# Patient Record
Sex: Male | Born: 1990 | Race: White | Hispanic: No | Marital: Married | Smoking: Never smoker
Health system: Southern US, Community
[De-identification: ages and names within clinical notes are randomized; demographics above are authoritative.]

## PROBLEM LIST (undated history)

## (undated) DIAGNOSIS — I10 Essential (primary) hypertension: Secondary | ICD-10-CM

---

## 2016-06-25 ENCOUNTER — Encounter (HOSPITAL_BASED_OUTPATIENT_CLINIC_OR_DEPARTMENT_OTHER): Payer: Self-pay | Admitting: *Deleted

## 2016-06-25 ENCOUNTER — Emergency Department (HOSPITAL_BASED_OUTPATIENT_CLINIC_OR_DEPARTMENT_OTHER): Payer: BLUE CROSS/BLUE SHIELD

## 2016-06-25 ENCOUNTER — Emergency Department (HOSPITAL_BASED_OUTPATIENT_CLINIC_OR_DEPARTMENT_OTHER)
Admission: EM | Admit: 2016-06-25 | Discharge: 2016-06-25 | Disposition: A | Discharge status: Home / Self Care | Payer: BLUE CROSS/BLUE SHIELD | Attending: Emergency Medicine | Admitting: Emergency Medicine

## 2016-06-25 DIAGNOSIS — R1032 Left lower quadrant pain: Secondary | ICD-10-CM | POA: Insufficient documentation

## 2016-06-25 DIAGNOSIS — R197 Diarrhea, unspecified: Secondary | ICD-10-CM | POA: Diagnosis not present

## 2016-06-25 LAB — COMPREHENSIVE METABOLIC PANEL
ALT: 49 U/L (ref 17–63)
AST: 72 U/L — ABNORMAL HIGH (ref 15–41)
Albumin: 4.3 g/dL (ref 3.5–5.0)
Alkaline Phosphatase: 52 U/L (ref 38–126)
Anion gap: 9 (ref 5–15)
BUN: 16 mg/dL (ref 6–20)
CHLORIDE: 103 mmol/L (ref 101–111)
CO2: 26 mmol/L (ref 22–32)
Calcium: 9.2 mg/dL (ref 8.9–10.3)
Creatinine, Ser: 1.04 mg/dL (ref 0.61–1.24)
Glucose, Bld: 100 mg/dL — ABNORMAL HIGH (ref 65–99)
POTASSIUM: 3.5 mmol/L (ref 3.5–5.1)
SODIUM: 138 mmol/L (ref 135–145)
Total Bilirubin: 0.4 mg/dL (ref 0.3–1.2)
Total Protein: 7.7 g/dL (ref 6.5–8.1)

## 2016-06-25 LAB — URINALYSIS, ROUTINE W REFLEX MICROSCOPIC
BILIRUBIN URINE: NEGATIVE
Glucose, UA: NEGATIVE mg/dL
Hgb urine dipstick: NEGATIVE
Ketones, ur: NEGATIVE mg/dL
Leukocytes, UA: NEGATIVE
NITRITE: NEGATIVE
PROTEIN: NEGATIVE mg/dL
SPECIFIC GRAVITY, URINE: 1.027 (ref 1.005–1.030)
pH: 6.5 (ref 5.0–8.0)

## 2016-06-25 LAB — CBC
HEMATOCRIT: 45.5 % (ref 39.0–52.0)
HEMOGLOBIN: 16 g/dL (ref 13.0–17.0)
MCH: 30.5 pg (ref 26.0–34.0)
MCHC: 35.2 g/dL (ref 30.0–36.0)
MCV: 86.7 fL (ref 78.0–100.0)
Platelets: 240 10*3/uL (ref 150–400)
RBC: 5.25 MIL/uL (ref 4.22–5.81)
RDW: 12.9 % (ref 11.5–15.5)
WBC: 6.9 10*3/uL (ref 4.0–10.5)

## 2016-06-25 LAB — LIPASE, BLOOD: LIPASE: 33 U/L (ref 11–51)

## 2016-06-25 MED ORDER — HYDROCODONE-ACETAMINOPHEN 5-325 MG PO TABS: 2.0000 | ORAL_TABLET | ORAL | 0 refills | Status: DC | PRN

## 2016-06-25 MED ORDER — MORPHINE SULFATE (PF) 4 MG/ML IV SOLN
4.0000 mg | Freq: Once | INTRAVENOUS | Status: AC
  Administered 2016-06-25: 4 mg via INTRAVENOUS
  Filled 2016-06-25: qty 1

## 2016-06-25 MED ORDER — IOPAMIDOL (ISOVUE-300) INJECTION 61%
100.0000 mL | Freq: Once | INTRAVENOUS | Status: AC | PRN
Start: 2016-06-25 — End: 2016-06-25
  Administered 2016-06-25: 100 mL via INTRAVENOUS

## 2016-06-25 NOTE — ED Notes (Signed)
Patient transported to CT 

## 2016-06-25 NOTE — ED Notes (Signed)
Pt just used the restroom before triage.

## 2016-06-25 NOTE — ED Notes (Signed)
ED Provider at bedside. 

## 2016-06-25 NOTE — ED Triage Notes (Signed)
Abdominal pain since yesterday. [facility] here to r/o diverticulitis.

## 2016-06-25 NOTE — Discharge Instructions (Signed)
All other labs have been normal today. Your urine is normal. Your CAT scan shows no signs of diverticulitis. He did have a slightly enlarged spleen. I would avoid any physical contact sports. You need to follow up with your primary care doctor for possible repeat imaging. If he developed any pain in your left upper quadrant, fevers, or any other infectious symptoms please return to the ED.

## 2016-06-25 NOTE — ED Provider Notes (Signed)
[facility]   CSN: [id number] Arrival date & time: 06/25/16  1823  By signing my name below, I, Ryan Long, attest that this documentation has been prepared under the direction and in the presence of [employer], PA-C. Electronically Signed: Garen Lah, Scribe. 06/25/2016. 7:44 PM.  History   Chief Complaint Chief Complaint  Patient presents with  . Abdominal Pain    The history is provided by the patient. No language interpreter was used.    HPI Comments:  Tommy Murphy is an otherwise healthy 26 y.o. male who presents to the Emergency Department complaining of intermittent, moderate LLQ abdominal pain onset yesterday, worsening and constant since this morning. Pt describes his pain as sharp. He notes [facility] and was [facility] to [facility] to r/o diverticulitis. He notes the pain exacerbates with ambulation and position changes, and his pain is alleviated mildly with sitting still. Pt has associated symptoms of loose, darker diarrhea and states his last BM was earlier this afternoon. Denies any melena or hematochezia. No treatments for his pain were tried prior to coming into the ED. Per pt, he has no h/o diverticulitis, but a maternal Fhx of diverticulitis. He denies nausea, vomiting, fever, hematochezia, urgency, frequency, hematuria, dysuria, difficulty urinating, and any other associated symptoms at this time.  History reviewed. No pertinent past medical history.  There are no active problems to display for this patient.  History reviewed. No pertinent surgical history.  Home Medications    Prior to Admission medications   Not on File   Family History No family history on file.  Social History Social History  Substance Use Topics  . Smoking status: Never Smoker  . Smokeless tobacco: Never Used  . Alcohol use Yes   Allergies   Patient has no known allergies.  Review of Systems Review of Systems  Constitutional: Negative  for fever.  Gastrointestinal: Positive for abdominal pain and diarrhea. Negative for blood in stool, nausea and vomiting.  Genitourinary: Negative for difficulty urinating, dysuria, frequency, hematuria and urgency.  All other systems reviewed and are negative.  Physical Exam Updated Vital Signs BP 134/96 (BP Location: Left Arm)   Pulse 80   Temp 99.7 F (37.6 C) (Oral)   Resp 20   Ht 6\' 1"  (1.854 m)   Wt 117.9 kg   SpO2 95%   BMI 34.30 kg/m   Physical Exam  Constitutional: He is oriented to person, place, and time. He appears well-developed and well-nourished. No distress.  Patient is nontoxic appearing and in no acute distress.  HENT:  Head: Normocephalic and atraumatic.  Eyes: Conjunctivae are normal. Pupils are equal, round, and reactive to light.  Neck: Normal range of motion. Neck supple.  Cardiovascular: Normal rate, regular rhythm, normal heart sounds and intact distal pulses.   Pulmonary/Chest: Effort normal and breath sounds normal.  Abdominal: Soft. Bowel sounds are normal. He exhibits no distension. There is tenderness in the left lower quadrant. There is no rigidity, no rebound, no guarding and no CVA tenderness.  Musculoskeletal: Normal range of motion.  Lymphadenopathy:    He has no cervical adenopathy.  Neurological: He is alert and oriented to person, place, and time.  Skin: Skin is warm and dry. Capillary refill takes less than 2 seconds.  Psychiatric: He has a normal mood and affect.  Nursing note and vitals reviewed.  ED Treatments / Results  DIAGNOSTIC STUDIES:  Oxygen Saturation is 99% on RA, normal by my interpretation.  COORDINATION OF CARE:  7:42 PM Discussed treatment plan with pt at bedside including CAT scan A/P and pt agreed to plan.  Labs (all labs ordered are listed, but only abnormal results are displayed) Labs Reviewed  COMPREHENSIVE METABOLIC PANEL - Abnormal; Notable for the following:       Result Value   Glucose, Bld 100 (*)     AST 72 (*)    All other components within normal limits  URINALYSIS, ROUTINE W REFLEX MICROSCOPIC  LIPASE, BLOOD  CBC   EKG  EKG Interpretation None      Radiology Ct Abdomen Pelvis W Contrast  Result Date: 06/25/2016 CLINICAL DATA:  Acute onset of left lower quadrant abdominal pain and diarrhea. Initial encounter. EXAM: CT ABDOMEN AND PELVIS WITH CONTRAST TECHNIQUE: Multidetector CT imaging of the abdomen and pelvis was performed using the standard protocol following bolus administration of intravenous contrast. CONTRAST:  100mL ISOVUE-300 IOPAMIDOL (ISOVUE-300) INJECTION 61% COMPARISON:  None. FINDINGS: Lower chest: The visualized lung bases are grossly clear. The visualized portions of the mediastinum are unremarkable. Hepatobiliary: The liver is unremarkable in appearance. The gallbladder is unremarkable in appearance. The common bile duct remains normal in caliber. Pancreas: The pancreas is within normal limits. Spleen:  The spleen appears enlarged, measuring 14.8 cm in length. Adrenals/Urinary Tract: The adrenal glands are unremarkable in appearance. The kidneys are within normal limits. There is no evidence of hydronephrosis. No renal or ureteral stones are identified. No perinephric stranding is seen. Stomach/Bowel: The stomach is unremarkable in appearance. The small bowel is within normal limits. The appendix is normal in caliber, without evidence of appendicitis. The colon is unremarkable in appearance. Vascular/Lymphatic: The abdominal aorta is unremarkable in appearance. The inferior vena cava is grossly unremarkable. No retroperitoneal lymphadenopathy is seen. No pelvic sidewall lymphadenopathy is identified. Reproductive: The bladder is mildly distended and grossly unremarkable. The prostate remains normal in size. Other: No additional soft tissue abnormalities are seen. Musculoskeletal: No acute osseous abnormalities are identified. The visualized musculature is unremarkable in  appearance. IMPRESSION: 1. No acute abnormality seen within the abdomen or pelvis. 2. Splenomegaly noted.  This may reflect the patient's baseline. Electronically Signed   By: Roanna RaiderJeffery  Chang M.D.   On: 06/25/2016 21:13    Procedures Procedures   Medications Ordered in ED Medications  morphine 4 MG/ML injection 4 mg (4 mg Intravenous Given 06/25/16 1954)  iopamidol (ISOVUE-300) 61 % injection 100 mL (100 mLs Intravenous Contrast Given 06/25/16 2046)    Initial Impression / Assessment and Plan / ED Course  I have reviewed the triage vital signs and the nursing notes.  Pertinent labs & imaging results that were available during my care of the patient were reviewed by me and considered in my medical decision making (see chart for details).     The patient presents to the ED with complaint of left lower quadrant abdominal pain onset 2 days ago. Patient was seen at urgent care today and sent to the ED for evaluation for diverticulitis. Patient does have tenderness in his left lower quadrant. No rebound or signs of peritonitis. Labs and reassuring. Patient without a leukocytosis. Lipase is normal. Given patient's tenderness we'll obtain CT of abdomen. CT of abdomen showed no acute findings for diverticulitis. Does note splenomegaly. Patient has no history. Patient denies any mono-like symptoms or any other infectious symptoms. He has no pain in his left upper quadrant. Did not feel this is associated patient's current pain. I have encouraged to follow up with his  primary care doctor for further workup as splenomegaly. Patient's pain was controlled with morphine. He remains afebrile, not tachycardic, not hypoxic in the ED. Urine without any signs of infection. Repeat abdominal exam is benign. Patient discharged home with symptomatic treatment and given strict instructions for follow-up with their primary care physician.  I have also discussed reasons to return immediately to the ER.  Patient expresses  understanding and agrees with plan. Patient was discussed with Dr. Karma Ganja who agrees with the above plan. Pt is hemodynamically stable, in NAD, & able to ambulate in the ED. Pain has been managed & has no complaints prior to dc. Pt is comfortable with above plan and is stable for discharge at this time. All questions were answered prior to disposition. Strict return precautions for f/u to the ED were discussed.       Final Clinical Impressions(s) / ED Diagnoses   Final diagnoses:  LLQ abdominal pain   New Prescriptions Discharge Medication List as of 06/25/2016  9:48 PM    START taking these medications   Details  HYDROcodone-acetaminophen (NORCO/VICODIN) 5-325 MG tablet Take 2 tablets by mouth every 4 (four) hours as needed., Starting Tue 06/25/2016, Print       I personally performed the services described in this documentation, which was scribed in my presence. The recorded information has been reviewed and is accurate.     Rise Mu, PA-C 06/26/16 2841    Jerelyn Scott, MD 06/26/16 [phone]

## 2017-06-08 ENCOUNTER — Ambulatory Visit (HOSPITAL_BASED_OUTPATIENT_CLINIC_OR_DEPARTMENT_OTHER)
Admission: RE | Admit: 2017-06-08 | Discharge: 2017-06-08 | Disposition: A | Discharge status: Home / Self Care | Payer: BLUE CROSS/BLUE SHIELD | Source: Ambulatory Visit | Attending: Family Medicine | Admitting: Family Medicine

## 2017-06-08 ENCOUNTER — Other Ambulatory Visit (HOSPITAL_BASED_OUTPATIENT_CLINIC_OR_DEPARTMENT_OTHER): Payer: Self-pay | Admitting: Family Medicine

## 2017-06-08 DIAGNOSIS — S93409A Sprain of unspecified ligament of unspecified ankle, initial encounter: Secondary | ICD-10-CM

## 2017-06-08 DIAGNOSIS — X58XXXA Exposure to other specified factors, initial encounter: Secondary | ICD-10-CM | POA: Diagnosis not present

## 2017-06-08 DIAGNOSIS — S8252XA Displaced fracture of medial malleolus of left tibia, initial encounter for closed fracture: Secondary | ICD-10-CM | POA: Insufficient documentation

## 2017-06-08 DIAGNOSIS — S99912A Unspecified injury of left ankle, initial encounter: Secondary | ICD-10-CM | POA: Diagnosis present

## 2018-02-16 IMAGING — DX DG ANKLE COMPLETE 3+V*L*
3 series · 3 of 3 positions shown · non-contrast
Comparison: None

CLINICAL DATA: Twisted ankle.  Medial pain.

EXAM:
LEFT ANKLE COMPLETE - 3+ VIEW

[ankle ap]
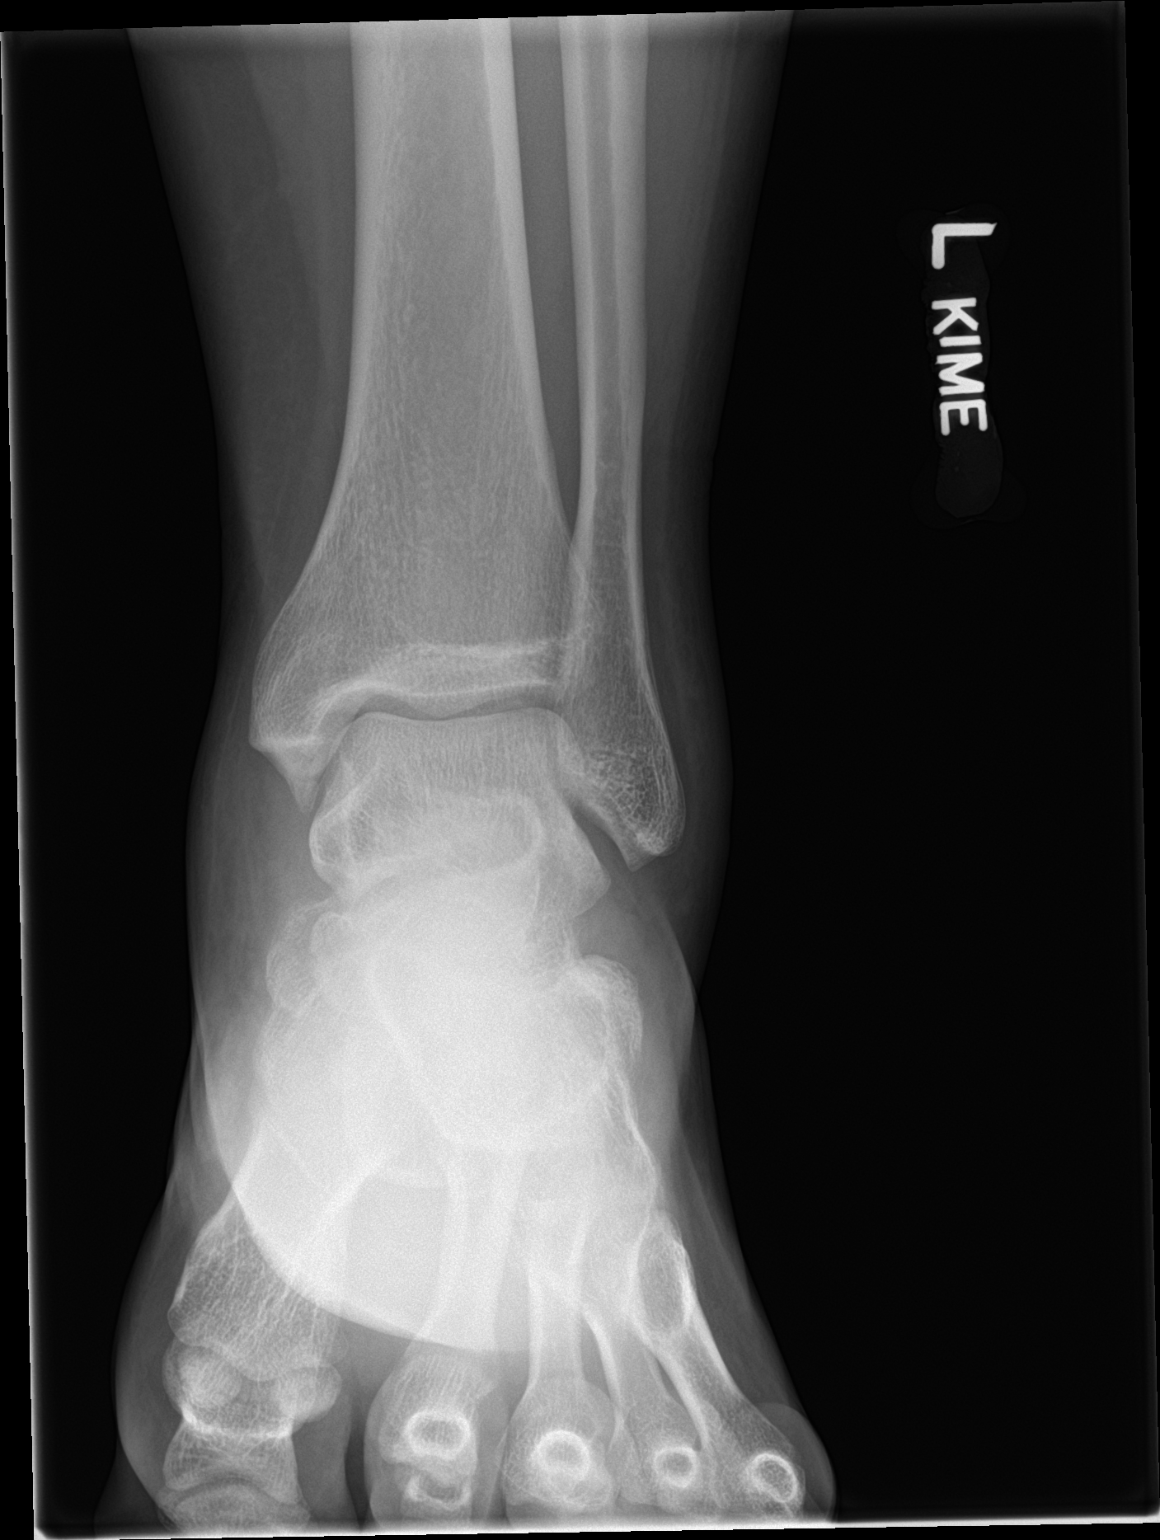

[ankle obl]
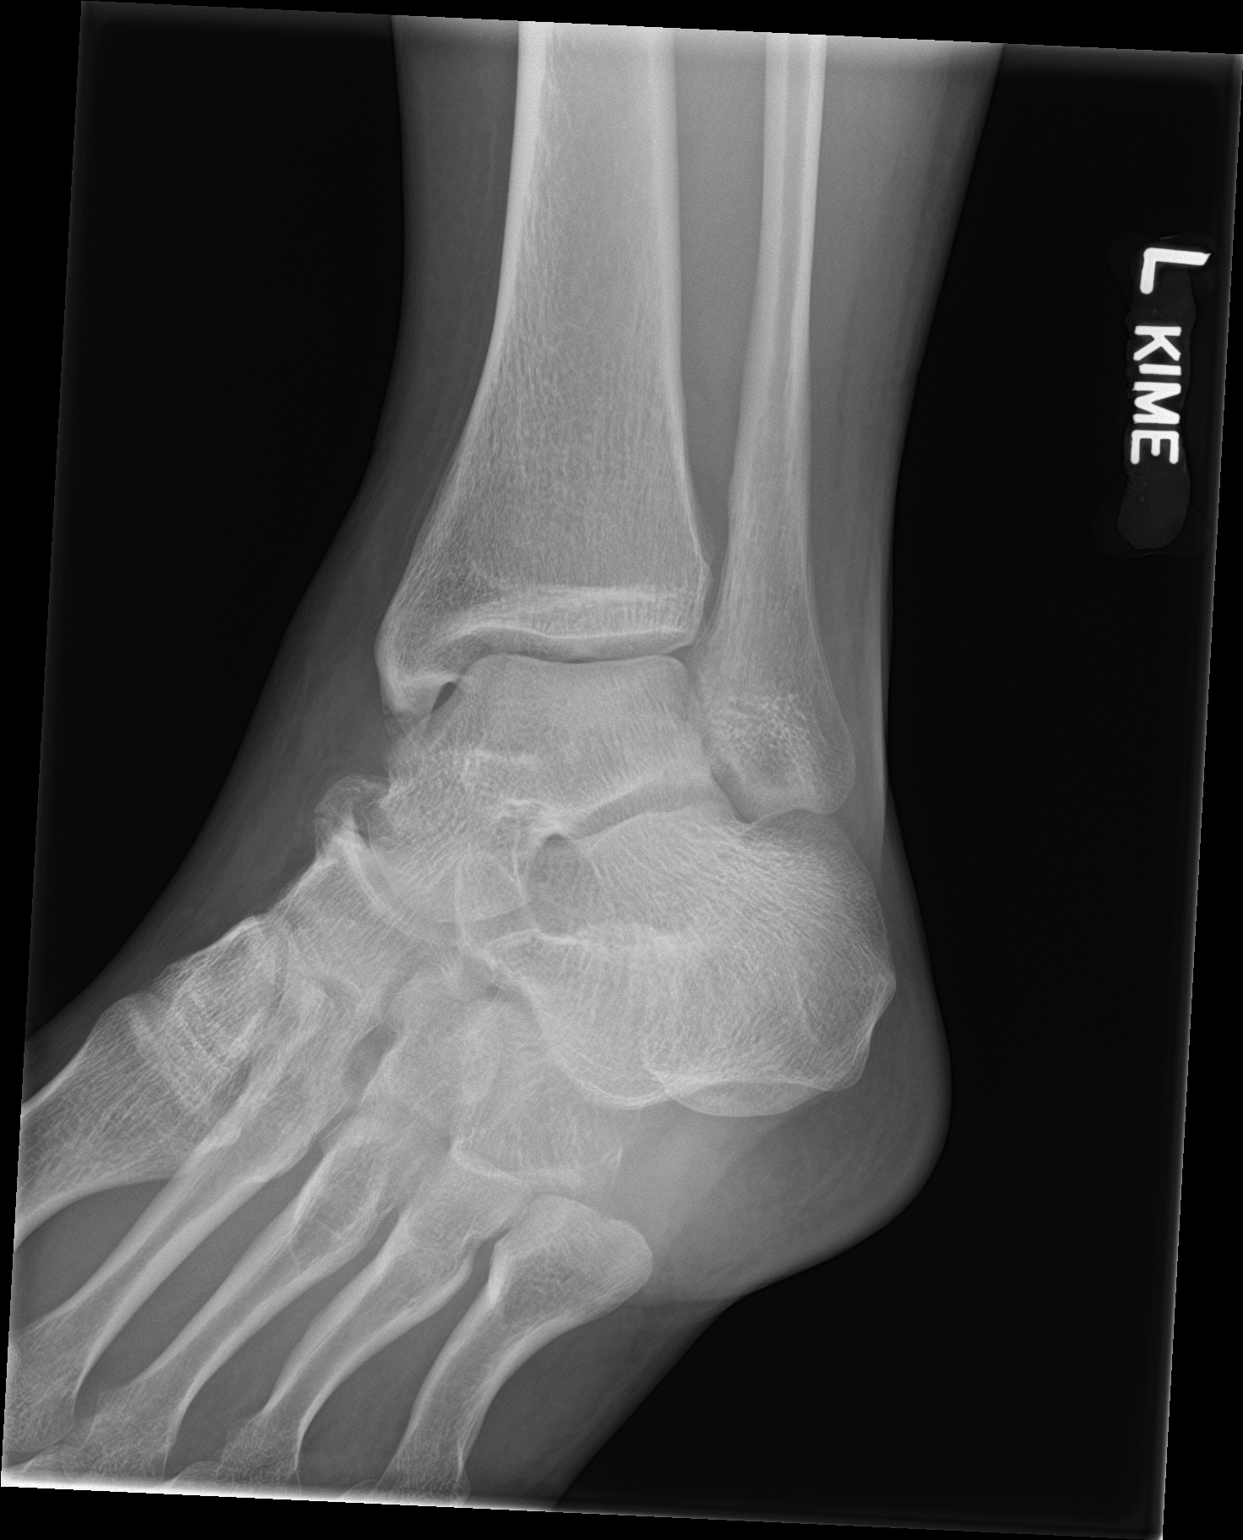

[ankle lat]
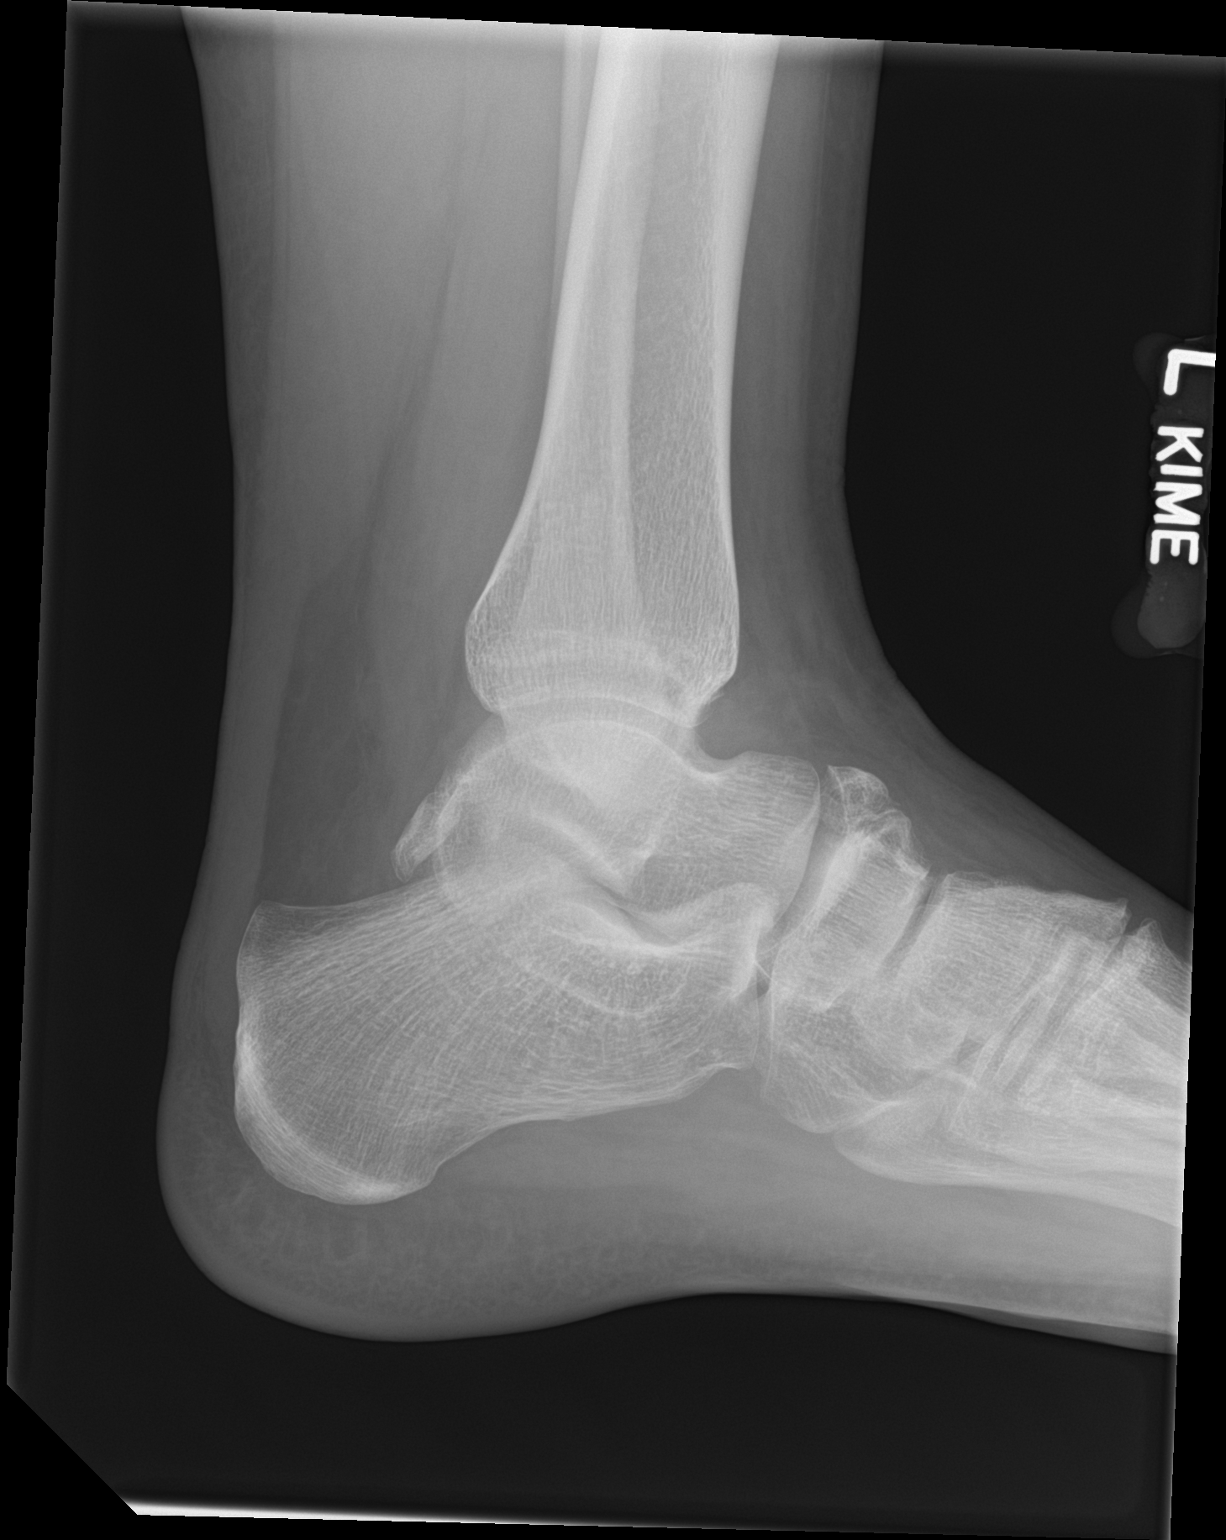

[3 of 3 positions shown; findings below may reference images not displayed]

FINDINGS: Small nondisplaced avulsion type fracture involving the distal tip
of the medial malleolus. Associated moderate medial soft tissue
swelling. The ankle mortise is maintained. No fracture of the
fibula. Large os trigonum is noted along with probable unfused
secondary ossification center or old avulsion fracture involving the
dorsum of the navicular bone.
IMPRESSION: Small avulsion fracture involving the distal tip of the medial
malleolus.

## 2021-11-24 ENCOUNTER — Emergency Department (HOSPITAL_BASED_OUTPATIENT_CLINIC_OR_DEPARTMENT_OTHER): Payer: 59

## 2021-11-24 ENCOUNTER — Encounter (HOSPITAL_BASED_OUTPATIENT_CLINIC_OR_DEPARTMENT_OTHER): Payer: Self-pay | Admitting: Urology

## 2021-11-24 ENCOUNTER — Other Ambulatory Visit: Payer: Self-pay

## 2021-11-24 DIAGNOSIS — W108XXA Fall (on) (from) other stairs and steps, initial encounter: Secondary | ICD-10-CM | POA: Diagnosis not present

## 2021-11-24 DIAGNOSIS — S65302A Unspecified injury of deep palmar arch of left hand, initial encounter: Secondary | ICD-10-CM | POA: Diagnosis present

## 2021-11-24 DIAGNOSIS — I10 Essential (primary) hypertension: Secondary | ICD-10-CM | POA: Diagnosis not present

## 2021-11-24 DIAGNOSIS — S62339A Displaced fracture of neck of unspecified metacarpal bone, initial encounter for closed fracture: Secondary | ICD-10-CM | POA: Insufficient documentation

## 2021-11-24 NOTE — ED Triage Notes (Signed)
Tripped going down stairs and hit left hand approx 1 hr PTA  Denies hitting head, denies LOC  Left hand pain and swelling

## 2021-11-25 ENCOUNTER — Emergency Department (HOSPITAL_BASED_OUTPATIENT_CLINIC_OR_DEPARTMENT_OTHER)
Admission: EM | Admit: 2021-11-25 | Discharge: 2021-11-25 | Disposition: A | Discharge status: Home / Self Care | Payer: 59 | Attending: Emergency Medicine | Admitting: Emergency Medicine

## 2021-11-25 DIAGNOSIS — S62339A Displaced fracture of neck of unspecified metacarpal bone, initial encounter for closed fracture: Secondary | ICD-10-CM

## 2021-11-25 HISTORY — DX: Essential (primary) hypertension: I10

## 2021-11-25 NOTE — ED Provider Notes (Signed)
[facility] Provider Note: Lowella Dell, MD, FACEP  CSN: [id number] MRN: [id number] ARRIVAL: 11/24/21 at 2201 ROOM: [facility]08/[facility]08   CHIEF COMPLAINT  Hand Injury   HISTORY OF PRESENT ILLNESS  11/25/21 12:35 AM Tommy Murphy is a 31 y.o. male who tripped going down the stairs about 1 hour prior to arrival.  He hit his left hand and is having pain overlying the left fifth metacarpal with associated swelling.  He rates his pain as a 3 out of 10, worse with movement or palpation.  He denies other injury.  He did not hit his head or lose consciousness.  He is having no associated numbness or functional deficit in his left fifth finger.   Past Medical History:  Diagnosis Date   Hypertension     History reviewed. No pertinent surgical history.  History reviewed. No pertinent family history.  Social History   Tobacco Use   Smoking status: Never   Smokeless tobacco: Never  Substance Use Topics   Alcohol use: Yes    Comment: occ   Drug use: Never    Prior to Admission medications   Not on File    Allergies Patient has no known allergies.   REVIEW OF SYSTEMS  Negative except as noted here or in the History of Present Illness.   PHYSICAL EXAMINATION  Initial Vital Signs Blood pressure (!) 160/96, pulse (!) 102, temperature 99.1 F (37.3 C), temperature source Oral, resp. rate 19, height 6\' 1"  (1.854 m), weight 122.5 kg, SpO2 98 %.  Examination General: Well-developed, well-nourished male in no acute distress; appearance consistent with age of record HENT: normocephalic; atraumatic Eyes: Normal appearance Neck: supple; nontender Heart: regular rate and rhythm Lungs: clear to auscultation bilaterally Abdomen: soft; nondistended; nontender; bowel sounds present Extremities: No deformity; full range of motion; swelling and tenderness overlying the left fifth metacarpal, left fifth finger neurovascularly intact with intact tendon function Neurologic: Awake, alert  and oriented; motor function intact in all extremities and symmetric; no facial droop Skin: Warm and dry Psychiatric: Normal mood and affect   RESULTS  Summary of this visit's results, reviewed and interpreted by myself:   EKG Interpretation  Date/Time:    Ventricular Rate:    PR Interval:    QRS Duration:   QT Interval:    QTC Calculation:   R Axis:     Text Interpretation:         Laboratory Studies: No results found for this or any previous visit (from the past 24 hour(s)). Imaging Studies: DG Hand Complete Left  Result Date: 11/24/2021 CLINICAL DATA:  Status post fall. EXAM: LEFT HAND - COMPLETE 3+ VIEW COMPARISON:  None Available. FINDINGS: An acute, mildly displaced fracture is seen involving the mid to distal aspect of the fifth left metacarpal. There is no evidence of dislocation. There is no evidence of arthropathy or other focal bone abnormality. Moderate severity soft tissue swelling is seen adjacent to the previously noted fracture site. IMPRESSION: Acute, mildly displaced fracture of the fifth left metacarpal. Electronically Signed   By: 01/25/2022 M.D.   On: 11/24/2021 22:30    ED COURSE and MDM  Nursing notes, initial and subsequent vitals signs, including pulse oximetry, reviewed and interpreted by myself.  Vitals:   11/24/21 2211 11/24/21 2213  BP:  (!) 160/96  Pulse:  (!) 102  Resp:  19  Temp:  99.1 F (37.3 C)  TempSrc:  Oral  SpO2:  98%  Weight: 122.5 kg   Height:  6\' 1"  (1.854 m)    Medications - No data to display  We will place the patient and on an ulnar gutter splint refer to hand surgery for definitive surgical treatment.  PROCEDURES  Procedures   ED DIAGNOSES     ICD-10-CM   1. Closed boxer's fracture, initial encounter  S62.339A          Ellee Wawrzyniak, , MD 11/25/21 [phone]
